# Patient Record
Sex: Male | Born: 1940 | Hispanic: No | Marital: Married | Smoking: Current every day smoker
Health system: Southern US, Community
[De-identification: ages and names within clinical notes are randomized; demographics above are authoritative.]

## PROBLEM LIST (undated history)

## (undated) DIAGNOSIS — C61 Malignant neoplasm of prostate: Secondary | ICD-10-CM

## (undated) HISTORY — PX: HERNIA REPAIR: SHX51

## (undated) HISTORY — PX: PROSTATE BIOPSY: SHX241

---

## 2004-02-11 ENCOUNTER — Ambulatory Visit (HOSPITAL_COMMUNITY): Admission: RE | Admit: 2004-02-11 | Discharge: 2004-02-11 | Payer: Self-pay | Admitting: Surgery

## 2018-05-10 DIAGNOSIS — R972 Elevated prostate specific antigen [PSA]: Secondary | ICD-10-CM | POA: Insufficient documentation

## 2018-05-12 DIAGNOSIS — N402 Nodular prostate without lower urinary tract symptoms: Secondary | ICD-10-CM | POA: Insufficient documentation

## 2018-06-19 DIAGNOSIS — C61 Malignant neoplasm of prostate: Secondary | ICD-10-CM | POA: Insufficient documentation

## 2018-06-25 ENCOUNTER — Telehealth: Payer: Self-pay | Admitting: Radiation Oncology

## 2018-06-25 NOTE — Telephone Encounter (Signed)
As requested by Dr. Tyler Pita I found the patient to confirm appointment in [facility] for Friday, June 27, 2018 at 0800. Provided patient with directions to [facility]. Patient verbalized understanding and expressed appreciation for the call. Sent email request to Leonard Downing to obtain slides from [facility] and prepare a chart.

## 2018-06-26 ENCOUNTER — Telehealth: Payer: Self-pay | Admitting: Medical Oncology

## 2018-06-26 ENCOUNTER — Encounter: Payer: Self-pay | Admitting: Radiation Oncology

## 2018-06-26 NOTE — Telephone Encounter (Signed)
Spoke with son to confirm appointment for [facility] 3/13 arriving at 8:am. He is currently a patient at [facility] and is seeing a second opinion for prostate cancer.

## 2018-06-26 NOTE — Progress Notes (Signed)
GU Location of Tumor / Histology: prostatic adenocarcinoma   If Prostate Cancer, Gleason Score is (4 + 3) and PSA is (138). Prostate volume: 53.35  [facility] had a routine physical examination and found to have a PSA of 143. He had a repeat PSA that was 138. He has no family history of prostate ca.  Biopsies of prostate (if applicable) revealed:  A. PROSTATE, RB, BIOSPY: Adenocarcinoma, Gleason grade 4+3=7 (grade group 3), comprising 25% of 2 (of 2) cores.  B. PROSTATE, RM, BIOSPY: Adenocarcinoma, Gleason grade 4+3=7 (grade group 3), comprising 85% of 2 (of 2) cores.  C. PROSTATE, RA, BIOSPY: Adenocarcinoma, Gleason grade 4+3=7 (grade group 3), comprising 70% of 2 (of 2) cores.  D. PROSTATE, LB, BIOSPY: Adenocarcinoma, Gleason grade 4+3=8 (grade group 3), comprising 5% of 1 (of 2) cores.  E. PROSTATE, LM, BIOSPY: Rare atypical small acinar proliferation.  F. PROSTATE, LA, BIOSPY: Adenocarcinoma, Gleason grade 4+3=7 (grade group 3), comprising 25% of 2 (of 2) cores   Report Prepared By: Willaim Bane , MD  I have personally reviewed the slides and/or other related materials referenced, and have edited the report as part of my pathologic assessment and final interpretation.  Past/Anticipated interventions by urology, if any: biopsy, referral to medical oncology  Past/Anticipated interventions by medical oncology, if any: planning for ADT +/- Enzalutamide  Weight changes, if any: no  Bowel/Bladder complaints, if any:  Denies dysuria, hematuria, urinary frequency, or urgency.    Nausea/Vomiting, if any: no  Pain issues, if any:  no  SAFETY ISSUES:  Prior radiation?   Pacemaker/ICD? no  Possible current pregnancy? no, male patient  Is the patient on methotrexate? no  Current Complaints / other details:  78 year old male. NKDA. Working full time and playing golf 3 times per week. Current everyday  smoker of Cigars. Sister with hx of cancer. PET revealed a mildly avid 1.2 cm left inguinal node.

## 2018-06-27 ENCOUNTER — Telehealth: Payer: Self-pay | Admitting: Oncology

## 2018-06-27 ENCOUNTER — Ambulatory Visit
Admission: RE | Admit: 2018-06-27 | Discharge: 2018-06-27 | Disposition: A | Discharge status: Home / Self Care | Payer: Medicare Other | Source: Ambulatory Visit | Attending: Radiation Oncology | Admitting: Radiation Oncology

## 2018-06-27 ENCOUNTER — Inpatient Hospital Stay: Payer: Medicare Other | Attending: Oncology | Admitting: Oncology

## 2018-06-27 ENCOUNTER — Encounter: Payer: Self-pay | Admitting: Radiation Oncology

## 2018-06-27 ENCOUNTER — Encounter: Payer: Self-pay | Admitting: Medical Oncology

## 2018-06-27 ENCOUNTER — Other Ambulatory Visit: Payer: Self-pay

## 2018-06-27 VITALS — BP 146/89 | HR 81 | Temp 98.0°F | Resp 18 | Ht 71.0 in | Wt 178.4 lb

## 2018-06-27 DIAGNOSIS — Z8042 Family history of malignant neoplasm of prostate: Secondary | ICD-10-CM | POA: Insufficient documentation

## 2018-06-27 DIAGNOSIS — Z800 Family history of malignant neoplasm of digestive organs: Secondary | ICD-10-CM | POA: Diagnosis not present

## 2018-06-27 DIAGNOSIS — C774 Secondary and unspecified malignant neoplasm of inguinal and lower limb lymph nodes: Secondary | ICD-10-CM | POA: Diagnosis not present

## 2018-06-27 DIAGNOSIS — E291 Testicular hypofunction: Secondary | ICD-10-CM | POA: Diagnosis not present

## 2018-06-27 DIAGNOSIS — C61 Malignant neoplasm of prostate: Secondary | ICD-10-CM

## 2018-06-27 DIAGNOSIS — F17210 Nicotine dependence, cigarettes, uncomplicated: Secondary | ICD-10-CM | POA: Diagnosis not present

## 2018-06-27 HISTORY — DX: Malignant neoplasm of prostate: C61

## 2018-06-27 NOTE — Progress Notes (Signed)
                               Care Plan Summary  Name: Mr. Joseph Rose DOB: 12/16/1940  Your Medical Team:   Urologist -  Dr. Raynelle Bring, [facility]  Radiation Oncologist - Dr. Tyler Pita, [facility]   Medical Oncologist - Dr. Zola Button, [place]  Recommendations: 1) Bone scan   2) Androgen deprivation (hormone injection) x 2 years  3) Radiation  * These recommendations are based on information available as of today's consult.      Recommendations may change depending on the results of further tests or exams.  Next Steps: 1) I will follow up with you to see if Dr. Servando Salina will schedule bone scan/hormone injection or if you would like to schedule in [place].   When appointments need to be scheduled, you will be contacted by [facility] and/or [facility]   Patient provided with business cards for all team members and a copy of "Fall Prevention Patient Safety Sheet".  Questions?  Please do not hesitate to call Cira Rue, RN, BSN, OCN at [phone] any questions or concerns.  Shirlean Mylar is your Oncology Nurse Navigator and is available to assist you while you're receiving your medical care at [facility].

## 2018-06-27 NOTE — Progress Notes (Signed)
Radiation Oncology         [phone] ________________________________  Multidisciplinary Prostate Cancer Clinic  Initial Radiation Oncology Consultation  Name: Joseph Rose MRN: [id number]  Date: 06/27/2018  DOB: [birth year]  CC:No primary care provider on file.  No ref. provider found   REFERRING PHYSICIAN: No ref. provider found  DIAGNOSIS: 78 y.o. 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 Adenocarcinoma of prostate, stage 4 ([place]) C61     HISTORY OF PRESENT ILLNESS::Joseph Rose is a 78 y.o. gentleman.  He was noted to have an elevated PSA of 143 by his primary care physician at [facility].  Accordingly, he was referred for evaluation in urology by Dr. Tresa Endo on 05/12/2018,  digital rectal examination was performed at that time revealing slight firmness on the right side.  The patient proceeded to transrectal ultrasound with 12 biopsies of the prostate on 05/16/2018.  The prostate volume measured 53.35 cc.  Out of 12 core biopsies, 9 were positive.  The maximum Gleason score was 4+3, and this was seen in both right base, both right mid, both right apex, one left base, and both left apex cores.  He was referred to Dr. Emeterio Reeve at [facility] oncology on 06/04/2018. He underwent PET scan on 06/13/2018 with results revealing: mildly avid 1.2 cm left inguinal node, no other avid sites identified. The recommendation from Dr. Emeterio Reeve was to undergo systemic treatment due to assumption of metastatic disease.   The patient reviewed the biopsy results with his urologist and he has kindly been referred today to the multidisciplinary prostate cancer clinic for presentation of pathology and radiology studies in our conference for discussion of potential radiation treatment options and clinical evaluation. He is here for a second opinion.    PREVIOUS RADIATION THERAPY: No  PAST MEDICAL HISTORY:  has a  past medical history of Prostate cancer ([place]).    PAST SURGICAL HISTORY: Past Surgical History:  Procedure Laterality Date  . HERNIA REPAIR    . PROSTATE BIOPSY      FAMILY HISTORY: family history includes Cancer in his sister; Prostate cancer in his father.  SOCIAL HISTORY:  reports that he has been smoking cigars. He has never used smokeless tobacco. He reports current alcohol use. He reports that he does not use drugs.  ALLERGIES: Patient has no allergy information on record.  MEDICATIONS:  Current Outpatient Medications  Medication Sig Dispense Refill  . loratadine (CLARITIN) 10 MG tablet Take by mouth.    . Vitamin D, Ergocalciferol, (DRISDOL) 1.25 MG (50000 UT) CAPS capsule TK 1 C PO WEEKLY     No current facility-administered medications for this encounter.     REVIEW OF SYSTEMS:  On review of systems, the patient reports that he is doing well overall. He denies any chest pain, shortness of breath, cough, fevers, chills, night sweats, unintended weight changes. He denies any bowel disturbances, and denies abdominal pain, nausea or vomiting. He denies any new musculoskeletal or joint aches or pains. His IPSS and SHIM were completed and reviewed. A complete review of systems is obtained and is otherwise negative. He continues to work full time and play golf 3 days a week.   PHYSICAL EXAM:  Wt Readings from Last 3 Encounters:  06/27/18 178 lb 6.4 oz (80.9 kg)   Temp Readings from Last 3 Encounters:  06/27/18 98 F (36.7 C) (Oral)   BP  Readings from Last 3 Encounters:  06/27/18 (!) 146/89   Pulse Readings from Last 3 Encounters:  06/27/18 81   Pain Assessment Pain Score: 0-No pain/10  In general this is a well appearing Caucasian gentleman in no acute distress. He is alert and oriented x4 and appropriate throughout the examination. HEENT reveals that the patient is normocephalic, atraumatic. EOMs are intact. PERRLA. Skin is intact without any evidence of gross lesions.  Cardiovascular exam reveals a regular rate and rhythm, no clicks rubs or murmurs are auscultated. Chest is clear to auscultation bilaterally. Lymphatic assessment is performed and does not reveal any adenopathy in the cervical, supraclavicular, axillary, or inguinal chains. Abdomen has active bowel sounds in all quadrants and is intact. The abdomen is soft, non tender, non distended. Lower extremities are negative for pretibial pitting edema, deep calf tenderness, cyanosis or clubbing.  KPS = 100  100 - Normal; no complaints; no evidence of disease. 90   - Able to carry on normal activity; minor signs or symptoms of disease. 80   - Normal activity with effort; some signs or symptoms of disease. 18   - Cares for self; unable to carry on normal activity or to do active work. 60   - Requires occasional assistance, but is able to care for most of his personal needs. 50   - Requires considerable assistance and frequent medical care. 14   - Disabled; requires special care and assistance. 60   - Severely disabled; hospital admission is indicated although death not imminent. 67   - Very sick; hospital admission necessary; active supportive treatment necessary. 10   - Moribund; fatal processes progressing rapidly. 0     - Dead  Karnofsky DA, Abelmann WH, Craver LS and Burchenal JH [phone]) The use of the nitrogen mustards in the palliative treatment of carcinoma: with particular reference to bronchogenic carcinoma Cancer 1 [phone]   LABORATORY DATA:  No results found for: WBC, HGB, HCT, MCV, PLT No results found for: NA, K, CL, CO2 No results found for: ALT, AST, GGT, ALKPHOS, BILITOT   RADIOGRAPHY: No results found.    IMPRESSION/PLAN: 78 y.o. gentleman with Stage T2a adenocarcinoma of the prostate with a PSA of 138 and a Gleason score of 4+3.    We discussed the patient's workup and outlined the nature of prostate cancer in this setting. The patient's T stage, Gleason's score, and PSA put him into  the high risk group. Accordingly, he is eligible for a variety of potential treatment options including ADT in combination with 5-8 weeks of external radiation or prostatectomy. We discussed the option of brachytherapy with 5 weeks of external beam, but that he may not be a good candidate due to the presumption of metastatic disease. We discussed the available radiation techniques, and focused on the details and logistics and delivery. We discussed and outlined the risks, benefits, short and long-term effects associated with radiotherapy. We also detailed the role of ADT in the treatment of high risk prostate cancer and outlined the associated side effects that could be expected with this therapy.  At the end of the conversation the patient is interested in moving forward with 4 weeks of external beam therapy in combination with ADT similar to the STAMPEDE trial. He has not received his first Lupron injection. We will obtain a bone scan and begin ADT in the near future. He will be scheduled for simulation after 2 months of ADT. We will share our discussion with Dr. Rosana Hoes and move forward  with treatment planning in anticipation of beginning IMRT in the near future.      Nicholos Johns, PA-C    Tyler Pita, MD  [facility] Direct Dial: [phone]  Fax: [phone] [email or web address]  Skype  LinkedIn

## 2018-06-27 NOTE — Progress Notes (Signed)
Reason for the request:   Prostate cancer  HPI: 78 year old man currently of [place] where he lived 68 of his life.  He is a rather healthy man who was found to have an elevated PSA of 143.  Based on these findings he was referred to Dr. Rosana Hoes who underwent a prostate biopsy on January 31 of 2020 and showed a Gleason score 4+3 equal 7 in 5 cores.  He underwent staging scan with Axumin PET completed on February 28 of 2020.  The scan shows diffuse uptake in the prostate predominantly in the left.  A 1.2 cm left inguinal node that is mildly avid without any other uptake in any other areas.  He was evaluated by Dr. Jess Barters at [facility] and recommended proceeding with systemic therapy alone utilizing androgen deprivation therapy with possibly adding therapy escalation utilizing enzalutamide.  Local therapy can also be considered if he develops excellent response to his disease.  He was referred to the prostate cancer multidisciplinary clinic for second opinion.  Clinically, he remains asymptomatic without any significant urinary symptoms.  He denies any dysuria, hematuria or excessive fatigue.  He remains active and attends activities of daily living.   He does not report any headaches, blurry vision, syncope or seizures. Does not report any fevers, chills or sweats.  Does not report any cough, wheezing or hemoptysis.  Does not report any chest pain, palpitation, orthopnea or leg edema.  Does not report any nausea, vomiting or abdominal pain.  Does not report any constipation or diarrhea.  Does not report any skeletal complaints.    Does not report frequency, urgency or hematuria.  Does not report any skin rashes or lesions. Does not report any heat or cold intolerance.  Does not report any lymphadenopathy or petechiae.  Does not report any anxiety or depression.  Remaining review of systems is negative.    Past Medical History:  Diagnosis Date  . Prostate cancer [facility])    :  Past Surgical History:  Procedure Laterality Date  . HERNIA REPAIR    . PROSTATE BIOPSY    :   Current Outpatient Medications:  .  loratadine (CLARITIN) 10 MG tablet, Take by mouth., Disp: , Rfl:  .  Vitamin D, Ergocalciferol, (DRISDOL) 1.25 MG (50000 UT) CAPS capsule, TK 1 C PO WEEKLY, Disp: , Rfl: :  Not on File:  Family History  Problem Relation Age of Onset  . Cancer Sister   . Prostate cancer Father        after age 72  :  Social History   Socioeconomic History  . Marital status: Married    Spouse name: Not on file  . Number of children: Not on file  . Years of education: Not on file  . Highest education level: Not on file  Occupational History  . Not on file  Social Needs  . Financial resource strain: Not on file  . Food insecurity:    Worry: Not on file    Inability: Not on file  . Transportation needs:    Medical: Not on file    Non-medical: Not on file  Tobacco Use  . Smoking status: Current Every Day Smoker    Types: Cigars  . Smokeless tobacco: Never Used  Substance and Sexual Activity  . Alcohol use: Yes  . Drug use: Never  . Sexual activity: Not on file  Lifestyle  . Physical activity:    Days per week: Not on file    Minutes  per session: Not on file  . Stress: Not on file  Relationships  . Social connections:    Talks on phone: Not on file    Gets together: Not on file    Attends religious service: Not on file    Active member of club or organization: Not on file    Attends meetings of clubs or organizations: Not on file    Relationship status: Not on file  . Intimate partner violence:    Fear of current or ex partner: Not on file    Emotionally abused: Not on file    Physically abused: Not on file    Forced sexual activity: Not on file  Other Topics Concern  . Not on file  Social History Narrative  . Not on file  :  Pertinent items are noted in HPI.  Exam:  ECOG 0 General appearance: alert and cooperative appeared without  distress. Head: atraumatic without any abnormalities. Eyes: conjunctivae/corneas clear. PERRL.  Sclera anicteric. Throat: lips, mucosa, and tongue normal; without oral thrush or ulcers. Resp: clear to auscultation bilaterally without rhonchi, wheezes or dullness to percussion. Cardio: regular rate and rhythm, S1, S2 normal, no murmur, click, rub or gallop GI: soft, non-tender; bowel sounds normal; no masses,  no organomegaly Skin: Skin color, texture, turgor normal. No rashes or lesions Lymph nodes: Cervical, supraclavicular, and axillary nodes normal. Neurologic: Grossly normal without any motor, sensory or deep tendon reflexes. Musculoskeletal: No joint deformity or effusion.    Assessment and Plan:   78 year old man with prostate cancer diagnosed in January 2020.  He presented with a PSA of 134 although repeated on March 4 of 2020 was 111.78.  Prostate biopsy obtained on January 2020 showed a Gleason score of 4+3 equal 7 and at least 4 cores.  His Axumin PET scan did not show any evidence of convincing metastatic prostate cancer although he does have low avid uptake in small inguinal lymph node.  His case was discussed today in the prostate cancer multidisciplinary clinic.  This includes discussion with our reviewing pathologist as well as review of his PET scan as well.  Treatment options were also reviewed today with the patient and his son.  The natural course of this disease was reviewed today and he understands he is probably advanced disease given his high PSA which is reflective of micrometastatic disease despite negative imaging studies.    Despite these findings, the rationale for treating his localized disease was discussed today.  And there is a lot of evidence to suggest treating primary prostate in the setting of oligo metastatic disease which could be extrapolated for a presumptive biochemical metastatic disease.  And after discussion today, he is in favor with proceeding with  radiation therapy in addition to continuing androgen deprivation.  He understands that androgen deprivation therapy will continue and remains the backbone of treating his advanced prostate cancer.  The role for therapy escalation was discussed today in the form of enzalutamide, abiraterone and systemic chemotherapy.  At this time it would be reasonable to proceed with androgen deprivation therapy alone and consideration for any additional therapy in the future.  All his questions were answered today to his satisfaction.  30  minutes was spent with the patient face-to-face today.  More than 50% of time was spent on reviewing imaging studies, pathology reports, discussing treatment options and answering questions regarding alternative therapy and options of care.   Thank you for the referral.  I had the pleasure of meeting this  patient today.  A copy of this consult has been forwarded to the requesting physician.

## 2018-06-27 NOTE — Addendum Note (Signed)
Encounter addended by: Raynelle Bring, MD on: 06/27/2018 5:09 PM  Actions taken: Clinical Note Signed

## 2018-06-27 NOTE — Telephone Encounter (Signed)
No los °

## 2018-06-27 NOTE — Addendum Note (Signed)
Encounter addended by: [name], PA-C on: 06/27/2018 5:27 PM  Actions taken: Visit Navigator Flowsheet section accepted, Order list changed, Diagnosis association updated

## 2018-06-27 NOTE — Consult Note (Signed)
Multi-Disciplinary Clinic     06/27/2018   --------------------------------------------------------------------------------   Joseph Rose  MRN: [id number]  PRIMARY CARE:  Kristie Cowman, MD  DOB: 30-Sep-1940, 78 year old Male  REFERRING:  Sheryle Spray, MD  SSN: -[id number]  PROVIDER:  Raynelle Bring, M.D.    LOCATION:  [facility] [phone]   --------------------------------------------------------------------------------   CC/HPI: CC: Prostate Cancer   PCP: Dr. Kristie Cowman  Location of consult: [facility]   Joseph Rose is a 78 year old gentleman who was found to have an elevated PSA of 143. He was seen by Dr. Lawerance Bach and his PSA was repeated and noted to still be elevated at 138. 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 He underwent staging imaging with a fluciclovine PET that indicated uptake in a 1.2 cm left inguinal lymph node. He has been evaluated by Dr. Heide Guile who has recommended systemic therapy with androgen deprivation with the option of additional systemic therapy (abiraterone, androgen receptor blockade, or docetaxel) with consideration of local therapy in the future pending his response to systemic therapy.   Family history: No prostate cancer or breast cancer.   Imaging studies:  Fluciclovine PET (06/12/18): Uptake noted in prostate and a mildly avid 1.2 cm left inguinal lymph node. No other sites of metastatic disease.   PMH: He has a history of gout, urolithiasis, and arthritis.  PSH: Hernia repair.   TNM stage: cT2 N0-1 M0  PSA: 138  Gleason score: 4+3=7  Biopsy (05/16/18 - [place], re-read by Dr. Orene Desanctis): 10/12 cores positive  Left: L apex (2/2 cores, 30% and 30%, 4+3=7), L base (2/2 cores, 5% and 5%, 4+3=7)  Right: R apex (2/2  cores, 95% and 95%, 4+3=7), R mid (2/2 cores, 90% and 90%, 4+3=7), R base (2/2 cores, 5% and 80%, 4+3=7)  Prostate volume: 53.4 cc    Urinary function: He has mild urinary urgency but denies significant bothersome symptoms.  Erectile function: He does have preserved erectile function.     ALLERGIES: No Allergies    MEDICATIONS: Celebrex 200 mg capsule Oral     GU PSH: No GU PSH    NON-GU PSH: No Non-GU PSH    GU PMH: History of urolithiasis      PMH Notes:  1898-04-16 00:00:00 - Note: Normal Routine History And Physical Adult   NON-GU PMH: Gout, Gout - 2014    FAMILY HISTORY: Family Health Status Number - Runs In Family Urologic Disorder - Father   SOCIAL HISTORY: No Social History     Notes: Tobacco Use, Caffeine Use, Occupation:, Marital History - Currently Married, Alcohol Use   REVIEW OF SYSTEMS:    GU Review Male:   Patient denies frequent urination, hard to postpone urination, burning/ pain with urination, get up at night to urinate, leakage of urine, stream starts and stops, trouble starting your streams, and have to strain to urinate .  Gastrointestinal (Upper):   Patient denies nausea and vomiting.  Gastrointestinal (Lower):   Patient denies constipation and diarrhea.  Constitutional:   Patient denies fever, night sweats, weight loss, and fatigue.  Skin:   Patient denies skin rash/ lesion and itching.  Eyes:   Patient denies blurred vision and double vision.  Ears/ Nose/ Throat:   Patient denies sore throat and  sinus problems.  Hematologic/Lymphatic:   Patient denies swollen glands and easy bruising.  Cardiovascular:   Patient denies leg swelling and chest pains.  Respiratory:   Patient denies cough and shortness of breath.  Endocrine:   Patient denies excessive thirst.  Musculoskeletal:   Patient denies back pain and joint pain.  Neurological:   Patient denies headaches and dizziness.  Psychologic:   Patient denies depression and anxiety.   VITAL SIGNS: None    MULTI-SYSTEM PHYSICAL EXAMINATION:    Constitutional: Well-nourished. No physical deformities. Normally developed. Good grooming.     PAST DATA REVIEWED:  Source Of History:  Patient  Lab Test Review:   PSA  Records Review:   Pathology Reports  X-Ray Review: PET Scan: Reviewed Films.     05/29/06 12/30/03  PSA  Total PSA 4.86  1.56   Free PSA 0.51    % Free PSA 10.5      PROCEDURES: None   ASSESSMENT:      ICD-10 Details  1 GU:   Prostate Cancer - C61    PLAN:           Schedule Return Visit/Planned Activity: Return PRN          Document Letter(s):  Created for Patient: Clinical Summary         Notes:   1. Very high-risk prostate cancer: I had a detailed discussion with Joseph Rose today regarding his prostate cancer situation. We discussed his situation in the multidisciplinary conference today and he has already seen both Dr. Tammi Klippel and Dr. Alen Blew. Based on his situation, he does not have obvious measurable metastatic disease although is at extremely high risk for harboring micrometastatic disease. We discussed the recommendation to proceed with more definitive bone imaging from his staging standpoint. This is to be arranged per Dr. Tammi Klippel. Assuming this is negative, discuss options for treatment. Currently, he has been recommended to proceed with systemic therapy with androgen deprivation therapy plus or minus additional systemic treatment.   We had further discussion today about the potential risks and benefits of proceeding with definitive treatment of his primary tumor and discussed the results from the STAMPEDE trial. He understands that the chance for cure would be possible but likely very low with treatment of his primary tumor. However, this might provide additional benefit with regard to time to progression of disease. Furthermore, this would help to decrease the risk of developing local symptoms related to his disease. He is in excellent overall health at age 78  and his life expectancy is likely 10 years. As such, the recommendation from our multidisciplinary group was for him to consider the option of radiation therapy to the prostate with long-term and possibly continuous androgen deprivation therapy. We discussed the option of androgen synthesis inhibitor or androgen receptor blockade therapy as well although did not make a strong recommendation for additional systemic therapy at this time. All questions were answered to his stated satisfaction. He is going to further consider his options and does have an appointment with Dr. Rosana Hoes later today to further review his options. He understands that he is in excellent hands with Dr. Rosana Hoes although I did offer to be available to help him in any way in the future if he would like.   It is anticipated that he likely will proceed with androgen deprivation therapy followed by radiation therapy to the prostate based on the discussion today. I will currently plan to allow Dr. Tammi Klippel to coordinate his care with Dr. Rosana Hoes  if he does elect to proceed in this fashion and both Dr. Alen Blew and myself will be happy to participate in his care if he so desires. Therefore, I will not currently scheduled him for any follow-up with me assuming that he continues his urologic care with Dr. Rosana Hoes.   CC: Dr. Tresa Endo  Dr. Heide Guile  Dr. Zola Button  Dr. Tyler Pita        Next Appointment:      Next Appointment: 07/01/2018 04:00 PM    Appointment Type: 10 Minute New Patient    Location: [facility] [phone]    Provider: Raynelle Bring, M.D.    Reason for Visit: prostate ca-pts son made appt      E & M CODE: I spent at least 36 minutes face to face with the patient, more than 50% of that time was spent on counseling and/or coordinating care.

## 2018-06-30 ENCOUNTER — Telehealth: Payer: Self-pay | Admitting: *Deleted

## 2018-06-30 ENCOUNTER — Telehealth: Payer: Self-pay | Admitting: Medical Oncology

## 2018-06-30 NOTE — Telephone Encounter (Signed)
Left message requesting a return call to discuss where he would like to get androgen deprivation at [facility] or here in [place]. He is scheduled for bone scan 07/03/18.

## 2018-06-30 NOTE — Telephone Encounter (Signed)
CALLED PATIENT TO INFORM OF BONE SCAN FOR 07-03-18 , ARRIVAL TIME - 10:45 AM , INJ. TO BE @ 11AM, PATIENT TO RETURN @ 2 PM FOR SCAN, SPOKE WITH PATIENT AND HE IS AWARE OF THIS APPT.

## 2018-07-01 ENCOUNTER — Encounter: Payer: Self-pay | Admitting: General Practice

## 2018-07-01 NOTE — Progress Notes (Signed)
[facility] Psychosocial Distress Screening Spiritual Care  Reached [name], who goes by his middle name Joseph Rose, by phone following Prostate Multidisciplinary Clinic to introduce [place] team/resources, reviewing distress screen per protocol.  The patient scored a 1 on the Psychosocial Distress Thermometer which indicates mild distress. Also assessed for distress and other psychosocial needs.   ONCBCN DISTRESS SCREENING 07/01/2018  Screening Type Initial Screening  Distress experienced in past week (1-10) 1  Referral to support programs Yes   [name] welcomed [facility]DC f/u call, reporting minimal distress, comfort in his team, and active lifestyle. Introduced Nature conservation officer with telephonic availability during COVID-19 precautions, including currently suspended [employer] that will become available again in the future.  Follow up needed: No. Per pt, no other needs at this time, but he knows to contact Team whenever desired for assistance or support.   [facility] [place], [place] Pager [phone] Voicemail [phone]

## 2018-07-03 ENCOUNTER — Ambulatory Visit (HOSPITAL_COMMUNITY): Payer: Medicare Other

## 2018-07-03 ENCOUNTER — Telehealth: Payer: Self-pay | Admitting: Medical Oncology

## 2018-07-03 NOTE — Telephone Encounter (Signed)
Patient called to inform me he rescheduled his bone scan to mid-April due to the COVID-19.

## 2018-07-15 ENCOUNTER — Telehealth: Payer: Self-pay | Admitting: *Deleted

## 2018-07-15 NOTE — Telephone Encounter (Signed)
CALLED PATIENT TO ASK ABOUT WHETHER HE WILL HAVE HIS SEEDS PLACED BY DR. DAVIS OR DR. BORDEN, PATIENT STATES THAT HE HAS AN APPT. WITH DR. DAVIS ON 10-27-18 BUT HE DOESN'T KNOW WHAT IT IS FOR

## 2018-07-16 ENCOUNTER — Telehealth: Payer: Self-pay | Admitting: Urology

## 2018-07-16 NOTE — Telephone Encounter (Signed)
Opened in error

## 2018-07-31 ENCOUNTER — Encounter: Payer: Self-pay | Admitting: Medical Oncology

## 2018-08-05 ENCOUNTER — Encounter (HOSPITAL_COMMUNITY): Payer: Medicare Other

## 2018-08-05 ENCOUNTER — Ambulatory Visit (HOSPITAL_COMMUNITY): Payer: Medicare Other

## 2018-08-05 ENCOUNTER — Telehealth: Payer: Self-pay | Admitting: *Deleted

## 2018-08-05 ENCOUNTER — Encounter (HOSPITAL_COMMUNITY): Payer: Self-pay

## 2018-08-05 NOTE — Telephone Encounter (Signed)
Called patient to inform of bone scan on May 1 - arrival time - 11:45 am @ [facility], lvm for a return call

## 2018-08-05 NOTE — Telephone Encounter (Signed)
Called patient to inform that bone scan has been moved to May 6 @ [facility], - 11:45 am - arrival time- 12 pm for injection and arrival time for return visit- 2:45 pm for 3 pm - scan, spoke with patient and he is aware of this test.

## 2018-08-15 ENCOUNTER — Encounter (HOSPITAL_COMMUNITY): Payer: Medicare Other

## 2018-08-15 ENCOUNTER — Other Ambulatory Visit (HOSPITAL_COMMUNITY): Payer: Medicare Other

## 2018-08-20 ENCOUNTER — Ambulatory Visit (HOSPITAL_COMMUNITY)
Admission: RE | Admit: 2018-08-20 | Discharge: 2018-08-20 | Disposition: A | Discharge status: Home / Self Care | Payer: Medicare Other | Source: Ambulatory Visit | Attending: Urology | Admitting: Urology

## 2018-08-20 ENCOUNTER — Other Ambulatory Visit: Payer: Self-pay

## 2018-08-20 ENCOUNTER — Encounter (HOSPITAL_COMMUNITY)
Admission: RE | Admit: 2018-08-20 | Discharge: 2018-08-20 | Disposition: A | Discharge status: Home / Self Care | Payer: Medicare Other | Source: Ambulatory Visit | Attending: Urology | Admitting: Urology

## 2018-08-20 DIAGNOSIS — C61 Malignant neoplasm of prostate: Secondary | ICD-10-CM | POA: Insufficient documentation

## 2018-08-20 MED ORDER — TECHNETIUM TC 99M MEDRONATE IV KIT
20.0000 | PACK | Freq: Once | INTRAVENOUS | Status: AC | PRN
  Administered 2018-08-20: 21.3 via INTRAVENOUS

## 2018-10-16 ENCOUNTER — Telehealth: Payer: Self-pay | Admitting: *Deleted

## 2018-10-16 NOTE — Telephone Encounter (Signed)
Called patient to ask whether he is going to have fid. markers placed on July 13 with Dr. Lawerance Bach, pt. Informed me that he hasn't decided to do this at this point, I did leave my name and my phone number and asked him to give me a call when he makes up his mind, notified Cira Rue prostate navigator

## 2018-10-23 ENCOUNTER — Telehealth: Payer: Self-pay | Admitting: Medical Oncology

## 2018-10-23 NOTE — Telephone Encounter (Signed)
I spoke with Joseph Rose to discuss treatment plan. He started ADT 4/15 with Dr. Alinda Money and had plans to move forward with radiation. When contacted by Enid Derry 7/2 he informed her that he has decided not to receive radiation at this time. He states that Dr. Servando Salina agreed with ADT but felt he could hold off on the radiation for now. I asked if he will continue care with Dr. Alinda Money or with Dr. Rosana Hoes. He states he would like to receive all his care in [place]. He does not have a follow up with Dr. Alinda Money. I will notify Dr. Alinda Money and Dr. Tammi Klippel of his decision to hold radiation but continue ADT. I asked him to call me with questions or concerns. He voiced understanding.

## 2018-11-25 ENCOUNTER — Telehealth: Payer: Self-pay | Admitting: *Deleted

## 2018-11-25 NOTE — Telephone Encounter (Signed)
CALLED PATIENT TO ASK QUESTION, LVM FOR A RETURN CALL 

## 2019-01-09 ENCOUNTER — Telehealth: Payer: Self-pay | Admitting: Medical Oncology

## 2019-01-09 NOTE — Telephone Encounter (Signed)
Spoke with patient to follow up on treatment plan for his prostate cancer. He states I am doing well and my PSA came down nicely. I asked if he has follow up with Dr. Alinda Money for labs and ADT and he stated, no. I gave him the number to [facility] and asked him to call and make an appointment. He has not made a decision to move forward with radiation. Ashlyn notified of the above.

## 2019-05-07 ENCOUNTER — Encounter: Payer: Self-pay | Admitting: *Deleted

## 2019-05-18 ENCOUNTER — Encounter: Payer: Self-pay | Admitting: Radiation Oncology

## 2019-05-18 NOTE — Progress Notes (Signed)
GU Location of Tumor / Histology: prostatic adenocarcinoma  If Prostate Cancer, Gleason Score is (4 + 3) and PSA is (138). Prostate volume: 53.4 cc    Biopsies of prostate(if applicable) revealed:   Past/Anticipated interventions by urology, if any: prostate biopsy, PET, bone scan, CT scan, started ADT in April 2020, restarted androgen deprivation (Eligard) last week.  To continue ADT for approximately 2 years at least until spring 2022. Reports hot flashes but, denies they are too bothersome. referral for consideration of radiotherapy    Past/Anticipated interventions by medical oncology, if any: no  Weight changes, if any: Denies. Denies even gaining weight after receiving ADT injection.  Bowel/Bladder complaints, if any: IPSS 7. SHIM 1. Denies dysuria, hematuria, urinary leakage or incontinence. He has mild urinary urgency but denies significant bothersome symptoms.   Nausea/Vomiting, if any: no  Pain issues, if any:  denies  SAFETY ISSUES:  Prior radiation? denies  Pacemaker/ICD? denies  Possible current pregnancy? no, male patient  Is the patient on methotrexate? no  Current Complaints / other details:  79 year old male. Married. Father with hx of prostate ca. Patient seen previously in [facility]. Patient and his wife were diagnosed with COVID in January. Patient reports they are just now beginning to recover.

## 2019-05-19 ENCOUNTER — Other Ambulatory Visit: Payer: Self-pay

## 2019-05-19 ENCOUNTER — Encounter: Payer: Self-pay | Admitting: Radiation Oncology

## 2019-05-19 ENCOUNTER — Ambulatory Visit
Admission: RE | Admit: 2019-05-19 | Discharge: 2019-05-19 | Disposition: A | Discharge status: Home / Self Care | Payer: Medicare Other | Source: Ambulatory Visit | Attending: Radiation Oncology | Admitting: Radiation Oncology

## 2019-05-19 VITALS — Ht 71.0 in | Wt 178.0 lb

## 2019-05-19 DIAGNOSIS — C61 Malignant neoplasm of prostate: Secondary | ICD-10-CM

## 2019-05-19 NOTE — Progress Notes (Signed)
Radiation Oncology         [phone] ________________________________  Outpatient Re-Consultation - Conducted via telephone due to current COVID-19 concerns for limiting Joseph Rose exposure  Name: Joseph Rose MRN: [id number]  Date: 05/19/2019  DOB: [birth year]  EL:FYBOF, Raynelle Dick, MD  Raynelle Bring, MD   REFERRING PHYSICIAN: Raynelle Bring, MD  DIAGNOSIS: 79 y.o. gentleman with locally advanced, stage T2a adenocarcinoma of the prostate with a Gleason's score of 4+3 and a PSA of 26.5 with ADT    ICD-10-CM   1. Adenocarcinoma of prostate, stage 4 ([place])  C61     HISTORY OF PRESENT ILLNESS::Joseph Rose is a 79 y.o. gentleman with a diagnosis of prostate cancer.  In summary, he was noted to have an elevated PSA of 143 by his primary care physician at [facility].  Accordingly, he was referred for evaluation in urology by Dr. Tresa Endo on 05/12/2018,  digital rectal examination was performed at that time revealing slight firmness on the right side.  The Joseph Rose proceeded to transrectal ultrasound with 12 biopsies of the prostate on 05/16/2018.  The prostate volume measured 53.35 cc.  Out of 12 core biopsies, 9 were positive.  The maximum Gleason score was 4+3, and this was seen in both of the right base cores, both right mid, both right apex, one left base, and both left apex cores.  He was referred to Dr. Emeterio Reeve at [facility] oncology on 06/04/2018. He underwent PET scan on 06/13/2018 with results revealing a mildly avid 1.2 cm left inguinal node but no other avid sites identified. The recommendation from Dr. Emeterio Reeve was to undergo systemic treatment due to assumption of oligometastatic disease.   He was seen in the multidisciplinary prostate clinic on 06/27/2018 with recommendation to proceed with treatment of curative intent with LT-ADT concurrent with hypo-fractionated prostate IMRT to include the pelvic nodes. Following that visit, he was started on ADT (Eligard) on 07/30/2018 with  Dr. Alinda Money. He underwent bone scan on 08/20/2018 which showed a single site of subtle abnormal increased tracer localization at the superior right iliac bone, corresponding to a potential small metastatic focus on CT. however, on further review of his prior CT scans, this lucency appeared stable for many years and there were no other sclerotic areas raising significant concern for metastatic prostate cancer.  He then sought a second opinion with Dr. Rosana Hoes and decided not to proceed with radiation therapy. When he met back with Dr. Alinda Money in 10/2018, his PSA was 19.8, vastly improved from 143. However, he remained undecided about treatment but intended to make a decision within the next 1-2 weeks. Unfortunately, he was lost to follow up for several months thereafter and missed his scheduled dose of Lupron in 01/2019.  He returned to Dr. Alinda Money on 05/06/2019 to further discuss his treatment options and reported that he was inclined to proceed with treatment of curative intent. A PSA repeated on 05/07/19 was 26.5, and he received another 45 mg Eligard ADT (6 month) injection on 05/13/2019.   He has kindly been referred back to [place] today to discuss proceeding with external beam radiation concurrent with ADT.  Of note, he and his wife contracted Covid about a month ago. He reports they are just now beginning to recover.    PREVIOUS RADIATION THERAPY: No  PAST MEDICAL HISTORY:  has a past medical history of Prostate cancer ([place]).    PAST SURGICAL HISTORY: Past Surgical History:  Procedure Laterality Date  . HERNIA REPAIR    .  PROSTATE BIOPSY      FAMILY HISTORY: family history includes Cancer in his sister; Prostate cancer (age of onset: 2) in his father.  SOCIAL HISTORY:  reports that he has been smoking cigars. He has smoked for the past 30.00 years. He has never used smokeless tobacco. He reports current alcohol use. He reports that he does not use drugs.  ALLERGIES: Joseph Rose has no known  allergies.  MEDICATIONS:  Current Outpatient Medications  Medication Sig Dispense Refill  . Ascorbic Acid (VITAMIN C) 1000 MG tablet Take 2,000 mg by mouth daily.    . Azelastine HCl 0.15 % SOLN SMARTSIG:2 Spray(s) Both Nares Twice Daily PRN    . Cholecalciferol (VITAMIN D3) 50 MCG (2000 UT) TABS Take 1 tablet by mouth daily.    . Ginseng 100 MG CAPS Take by mouth.    . NON FORMULARY Gout plex    . Turmeric (QC TUMERIC COMPLEX) 500 MG CAPS Take by mouth.    . Vitamin D, Ergocalciferol, (DRISDOL) 1.25 MG (50000 UT) CAPS capsule TK 1 C PO WEEKLY    . ALLEGRA-D ALLERGY & CONGESTION 180-240 MG 24 hr tablet Take 1 tablet by mouth daily.    Marland Kitchen loratadine (CLARITIN) 10 MG tablet Take by mouth.     No current facility-administered medications for this encounter.    REVIEW OF SYSTEMS:  On review of systems, the Joseph Rose reports that he is doing well overall. He denies any chest pain, shortness of breath, cough, fevers, chills, night sweats, unintended weight changes. He denies any bowel disturbances, and denies abdominal pain, nausea or vomiting. He denies any new musculoskeletal or joint aches or pains. His IPSS was 7, indicating mild to moderate symptoms. He reports mild urinary urgency but denies significant bothersome symptoms. His SHIM score was 1, indicating he has severe erectile dysfunction. A complete review of systems is obtained and is otherwise negative.   PHYSICAL EXAM:  Wt Readings from Last 3 Encounters:  05/19/19 178 lb (80.7 kg)  06/27/18 178 lb 6.4 oz (80.9 kg)   Temp Readings from Last 3 Encounters:  06/27/18 98 F (36.7 C) (Oral)   BP Readings from Last 3 Encounters:  06/27/18 (!) 146/89   Pulse Readings from Last 3 Encounters:  06/27/18 81   Pain Assessment Pain Score: 0-No pain/10  Physical exam not performed in light of telephone encounter.   KPS = 100  100 - Normal; no complaints; no evidence of disease. 90   - Able to carry on normal activity; minor signs or  symptoms of disease. 80   - Normal activity with effort; some signs or symptoms of disease. 45   - Cares for self; unable to carry on normal activity or to do active work. 60   - Requires occasional assistance, but is able to care for most of his personal needs. 50   - Requires considerable assistance and frequent medical care. 31   - Disabled; requires special care and assistance. 61   - Severely disabled; hospital admission is indicated although death not imminent. 83   - Very sick; hospital admission necessary; active supportive treatment necessary. 10   - Moribund; fatal processes progressing rapidly. 0     - Dead  Karnofsky DA, Abelmann WH, Craver LS and Burchenal JH [phone]) The use of the nitrogen mustards in the palliative treatment of carcinoma: with particular reference to bronchogenic carcinoma Cancer 1 634-56   LABORATORY DATA:  No results found for: WBC, HGB, HCT, MCV, PLT No results found  for: NA, K, CL, CO2 No results found for: ALT, AST, GGT, ALKPHOS, BILITOT   RADIOGRAPHY: No results found.    IMPRESSION/PLAN: This visit was conducted via telephone to spare the Joseph Rose unnecessary potential exposure in the healthcare setting during the current COVID-19 pandemic. 1. 79 y.o. gentleman with Stage cT2a, locally advanced adenocarcinoma of the prostate with a PSA of 26.5 on ADT, and a Gleason score of 4+3.   We discussed the Joseph Rose's workup and outlined the nature of prostate cancer in this setting. The Joseph Rose's T stage, Gleason's score, and PSA put him into the high risk group. Accordingly, he is eligible for a variety of potential treatment options including ADT in concurrent with 4 weeks of hypofractionated external radiation or prostatectomy. We discussed the available radiation techniques, and focused on the details and logistics of delivery. We discussed and outlined the risks, benefits, short and long-term effects associated with radiotherapy. We also dreviewed the role of  ADT in the treatment of high risk prostate cancer and outlined the associated side effects that could be expected with this therapy which he is familiar with, having started ADT in 06/2018.  At the end of the conversation, the Joseph Rose is interested in moving forward with 4 weeks of external beam therapy in combination with ADT similar to the STAMPEDE trial which will include a boost to the involved left inguinal node. His most recent Eligard injection was on 05/13/2019. We will contact [facility] to make arrangements for fiducial marker with [employer] to reduce rectal toxicity from radiotherapy placement prior to simulation. He will be scheduled for simulation/treatment planning shortly therafter in anticipation of beginning his daily radiation treatments in the near future. We will share our discussion with Dr. Alinda Money and move forward with treatment planning accordingly.  Given current concerns for Joseph Rose exposure during the COVID-19 pandemic, this encounter was conducted via telephone. The Joseph Rose was notified in advance and was offered a [place] meeting to allow for face to face communication but unfortunately reported that he did not have the appropriate resources/technology to support such a visit and instead preferred to proceed with telephone consult. The Joseph Rose has given verbal consent for this type of encounter. The time spent during this encounter was 45 minutes. The attendants for this meeting include Tyler Pita MD, Ashlyn Bruning PA-C, [place], and Joseph Rose, Joseph Rose. During the encounter, Tyler Pita MD, Ashlyn Bruning PA-C, and scribe, Wilburn Mylar were located at [place].  Joseph Rose, Joseph Rose was located at home.     Nicholos Johns, PA-C    Tyler Pita, MD  [facility] Direct Dial: [phone]  Fax: [phone] .com  Skype   LinkedIn   This document serves as a record of services personally performed by Tyler Pita, MD and [name], PA-C. It was created on their behalf by Wilburn Mylar, a trained medical scribe. The creation of this record is based on the scribe's personal observations and the provider's statements to them. This document has been checked and approved by the attending provider.

## 2019-05-19 NOTE — Progress Notes (Signed)
See progress note under physician encounter. 

## 2019-05-21 ENCOUNTER — Telehealth: Payer: Self-pay | Admitting: Medical Oncology

## 2019-05-21 NOTE — Telephone Encounter (Signed)
Left message to follow up post visit with Joseph Rose, [place].  I met patient in the [facility] 06/27/18. He started ADT but did not want to receive radiation at that time. He returned to see Dr. Alinda Money in January and has decided on curative tx. He received Eligard 1/27 and will be scheduled for CT simulation in the future. I asked him to call me with questions or concerns.

## 2019-05-27 ENCOUNTER — Telehealth: Payer: Self-pay | Admitting: *Deleted

## 2019-05-27 NOTE — Telephone Encounter (Signed)
Called patient to inform of fid. marker and space oar placement on 07-02-19 @ [facility] and his sim on 07-07-19 - arrival time- 10:45 am @ Dr. Johny Shears Office, spoke with patient's wife Hoyle Sauer and she is aware of these appts.

## 2019-06-03 ENCOUNTER — Other Ambulatory Visit: Payer: Self-pay | Admitting: Urology

## 2019-06-03 DIAGNOSIS — C61 Malignant neoplasm of prostate: Secondary | ICD-10-CM

## 2019-06-30 ENCOUNTER — Telehealth: Payer: Self-pay | Admitting: *Deleted

## 2019-06-30 NOTE — Telephone Encounter (Signed)
CALLED PATIENT TO INFORM OF SIM AND MRI FOR 07-21-19, SPOKE WITH PATIENT AND HE IS AWARE OF THESE APPTS.

## 2019-07-05 IMAGING — NM NUCLEAR MEDICINE WHOLE BODY BONE SCINTIGRAPHY
2 series · 2 of 2 positions shown · non-contrast
Comparison: None

Correlation: PET-CT 06/12/2018

CLINICAL DATA: Prostate cancer question stage IV/recurrent; most
recent PSA = 143

EXAM:
NUCLEAR MEDICINE WHOLE BODY BONE SCAN
TECHNIQUE: Whole body anterior and posterior images were obtained approximately
3 hours after intravenous injection of radiopharmaceutical.
RADIOPHARMACEUTICALS:  21.3 mCi 9echnetium-99m MDP IV

[Series 1: wbr_bone_40 whole body · 2.66mm/px · 1 of 1 slices shown (1 of 2)]
[im 1/1]
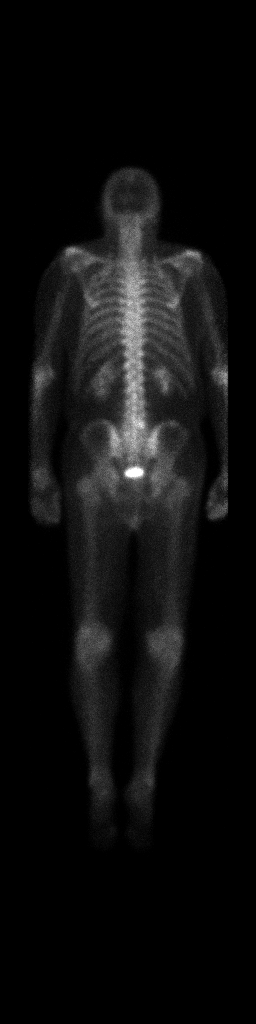

[Series 1: wbr_bone_40 whole body · 2.66mm/px · 1 of 1 slices shown (2 of 2)]
[im 1/1]
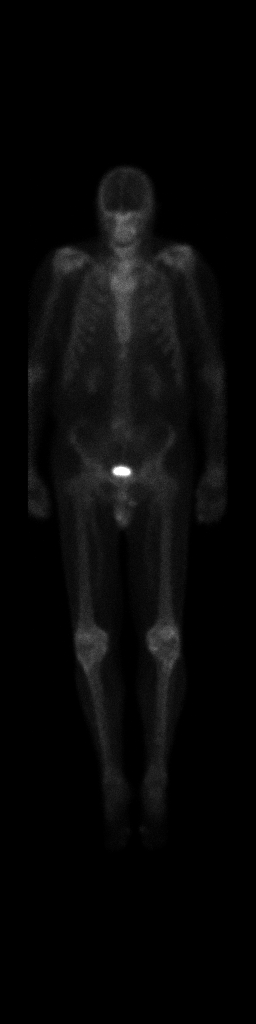

[2 of 2 positions shown; findings below may reference images not displayed]

FINDINGS: Minimal uptake at the hips and knees, typically degenerative.

Uptake in the lumbar spine at RIGHT L4-L5, typically degenerative.

Single subtle focus of abnormal increased tracer localization at the
superior aspect of the RIGHT iliac bone, corresponding to a faint
lytic lesion with a sclerotic rim on CT.

No additional sites of worrisome osseous tracer accumulation are
identified.

Expected urinary tract and soft tissue distribution of tracer.
IMPRESSION: Single site of subtle abnormal increased tracer localization at the
superior RIGHT iliac bone, corresponding to a potential small
metastatic focus on CT.

## 2019-07-06 ENCOUNTER — Encounter (INDEPENDENT_AMBULATORY_CARE_PROVIDER_SITE_OTHER): Payer: Self-pay | Admitting: Otolaryngology

## 2019-07-06 ENCOUNTER — Ambulatory Visit (INDEPENDENT_AMBULATORY_CARE_PROVIDER_SITE_OTHER): Payer: Medicare Other | Admitting: Otolaryngology

## 2019-07-06 ENCOUNTER — Other Ambulatory Visit: Payer: Self-pay

## 2019-07-06 VITALS — Temp 98.1°F

## 2019-07-06 DIAGNOSIS — J310 Chronic rhinitis: Secondary | ICD-10-CM | POA: Diagnosis not present

## 2019-07-06 NOTE — Progress Notes (Signed)
HPI: Joseph Rose is a 79 y.o. male who presents for evaluation of chronic postnasal drainage.  Patient apparently had a sinus infection back in November.  He complains of intermittent stuffy nose as well as some nasal drainage which is generally clear.  He is presently not using any nasal sprays. He was diagnosed with prostate cancer this past year and I reviewed a PET scan performed in January of this year at [facility] that demonstrated clear sinuses.  Past Medical History:  Diagnosis Date  . Prostate cancer [facility])    Past Surgical History:  Procedure Laterality Date  . HERNIA REPAIR    . PROSTATE BIOPSY     Social History   Socioeconomic History  . Marital status: Married    Spouse name: Not on file  . Number of children: Not on file  . Years of education: Not on file  . Highest education level: Not on file  Occupational History  . Not on file  Tobacco Use  . Smoking status: Current Every Day Smoker    Years: 31.00    Types: Cigars    Start date: 56  . Smokeless tobacco: Never Used  . Tobacco comment: only smokes when he plays golf.  Substance and Sexual Activity  . Alcohol use: Yes  . Drug use: Never  . Sexual activity: Not Currently  Other Topics Concern  . Not on file  Social History Narrative  . Not on file   Social Determinants of Health   Financial Resource Strain:   . Difficulty of Paying Living Expenses:   Food Insecurity:   . Worried About Charity fundraiser in the Last Year:   . Arboriculturist in the Last Year:   Transportation Needs:   . Film/video editor (Medical):   Marland Kitchen Lack of Transportation (Non-Medical):   Physical Activity:   . Days of Exercise per Week:   . Minutes of Exercise per Session:   Stress:   . Feeling of Stress :   Social Connections:   . Frequency of Communication with Friends and Family:   . Frequency of Social Gatherings with Friends and Family:   . Attends Religious Services:   . Active Member of Clubs or  Organizations:   . Attends Archivist Meetings:   Marland Kitchen Marital Status:    Family History  Problem Relation Age of Onset  . Cancer Sister        cancer in blood  . Prostate cancer Father 102  . Breast cancer Neg Hx   . Colon cancer Neg Hx   . Pancreatic cancer Neg Hx    No Known Allergies Prior to Admission medications   Medication Sig Start Date End Date Taking? Authorizing Provider  ALLEGRA-D ALLERGY & CONGESTION 180-240 MG 24 hr tablet Take 1 tablet by mouth daily. 02/18/19   [provider]  Ascorbic Acid (VITAMIN C) 1000 MG tablet Take 2,000 mg by mouth daily. 04/22/19   [provider]  Azelastine HCl 0.15 % SOLN SMARTSIG:2 Spray(s) Both Nares Twice Daily PRN 03/16/19   [provider]  Cholecalciferol (VITAMIN D3) 50 MCG (2000 UT) TABS Take 1 tablet by mouth daily. 04/22/19   [provider]  Ginseng 100 MG CAPS Take by mouth.    [provider]  loratadine (CLARITIN) 10 MG tablet Take by mouth.    [provider]  NON FORMULARY Gout plex    [provider]  Turmeric (QC TUMERIC COMPLEX) 500 MG  CAPS Take by mouth.    [provider]  Vitamin D, Ergocalciferol, (DRISDOL) 1.25 MG (50000 UT) CAPS capsule TK 1 C PO WEEKLY 04/30/18   [provider]     Positive ROS: Otherwise negative  All other systems have been reviewed and were otherwise negative with the exception of those mentioned in the HPI and as above.  Physical Exam: Constitutional: Alert, well-appearing, no acute distress Ears: External ears without lesions or tenderness. Ear canals are clear bilaterally with intact, clear TMs.  Nasal: External nose without lesions. Septum with minimal deformity..  Both middle meatus regions were clear with no signs of infection.  Mucus within the nasal cavity is clear. Oral: Lips and gums without lesions. Tongue and palate mucosa without lesions. Posterior oropharynx clear. Neck: No palpable adenopathy  or masses Respiratory: Breathing comfortably  Skin: No facial/neck lesions or rash noted.  Procedures  Assessment: Chronic rhinitis  Plan: Prescribed Nasacort 2 sprays each nostril at night.  Suggested use of saline irrigation during the day and suggested trying Xlear brand saline irrigation. He will follow-up if symptoms worsen.  Radene Journey, MD

## 2019-07-07 ENCOUNTER — Ambulatory Visit (HOSPITAL_COMMUNITY): Payer: Medicare Other

## 2019-07-07 ENCOUNTER — Ambulatory Visit: Payer: Medicare Other | Admitting: Radiation Oncology

## 2019-07-20 ENCOUNTER — Telehealth: Payer: Self-pay | Admitting: *Deleted

## 2019-07-20 NOTE — Telephone Encounter (Signed)
CALLED PATIENT TO REMIND OF SIM AND MRI APPT. FOR 07-21-19, SPOKE WITH PATIENT'S WIFE, CAROLYN AND SHE IS AWARE OF THESE APPTS.

## 2019-07-21 ENCOUNTER — Ambulatory Visit
Admission: RE | Admit: 2019-07-21 | Discharge: 2019-07-21 | Disposition: A | Discharge status: Home / Self Care | Payer: Medicare Other | Source: Ambulatory Visit | Attending: Radiation Oncology | Admitting: Radiation Oncology

## 2019-07-21 ENCOUNTER — Ambulatory Visit (HOSPITAL_COMMUNITY)
Admission: RE | Admit: 2019-07-21 | Discharge: 2019-07-21 | Disposition: A | Discharge status: Home / Self Care | Payer: Medicare Other | Source: Ambulatory Visit | Attending: Urology | Admitting: Urology

## 2019-07-21 ENCOUNTER — Other Ambulatory Visit: Payer: Self-pay

## 2019-07-21 DIAGNOSIS — C61 Malignant neoplasm of prostate: Secondary | ICD-10-CM | POA: Insufficient documentation

## 2019-07-21 DIAGNOSIS — Z510 Encounter for antineoplastic radiation therapy: Secondary | ICD-10-CM | POA: Diagnosis not present

## 2019-07-24 NOTE — Progress Notes (Signed)
  Radiation Oncology         [phone]) [phone] ________________________________  Name: Joseph Rose MRN: [id number]  Date: 07/21/2019  DOB: 1941/03/16  SIMULATION AND TREATMENT PLANNING NOTE    ICD-10-CM   1. Adenocarcinoma of prostate, stage 4 ([place])  C61     DIAGNOSIS:  79 y.o. 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  Identity was confirmed.  All relevant records and images related to the planned course of therapy were reviewed.  The patient freely provided informed written consent to proceed with treatment after reviewing the details related to the planned course of therapy. The consent form was witnessed and verified by the simulation staff.  Then, the patient was set-up in a stable reproducible supine position for radiation therapy.  A vacuum lock pillow device was custom fabricated to position his legs in a reproducible immobilized position.  Then, I performed a urethrogram under sterile conditions to identify the prostatic apex.  CT images were obtained.  Surface markings were placed.  The CT images were loaded into the planning software.  Then the prostate target and avoidance structures including the rectum, bladder, bowel and hips were contoured.  Treatment planning then occurred.  The radiation prescription was entered and confirmed.  A total of one complex treatment devices was fabricated. I have requested : Intensity Modulated Radiotherapy (IMRT) is medically necessary for this case for the following reason:  Rectal sparing.Marland Kitchen  PLAN:  The patient will receive 55 Gy in 20 fractions using the STAMPEDE fractionation.  ________________________________  Sheral Apley Tammi Klippel, M.D.

## 2019-07-27 ENCOUNTER — Encounter: Payer: Self-pay | Admitting: Medical Oncology

## 2019-07-27 DIAGNOSIS — Z510 Encounter for antineoplastic radiation therapy: Secondary | ICD-10-CM | POA: Diagnosis not present

## 2019-07-30 ENCOUNTER — Encounter: Payer: Self-pay | Admitting: Medical Oncology

## 2019-07-30 ENCOUNTER — Other Ambulatory Visit: Payer: Self-pay

## 2019-07-30 ENCOUNTER — Ambulatory Visit
Admission: RE | Admit: 2019-07-30 | Discharge: 2019-07-30 | Disposition: A | Discharge status: Home / Self Care | Payer: Medicare Other | Source: Ambulatory Visit | Attending: Radiation Oncology | Admitting: Radiation Oncology

## 2019-07-30 DIAGNOSIS — Z510 Encounter for antineoplastic radiation therapy: Secondary | ICD-10-CM | POA: Diagnosis not present

## 2019-07-30 NOTE — Progress Notes (Signed)
Joseph Rose her for first radiation treatment. We discussed the importance of having a comfortably full bladder prior to radiation to help reduce side effects. His bladder filling was not adequate on today's CT. He and his wife voiced understanding. I told him, it may take a few days of treatment before he gets it down.

## 2019-07-31 ENCOUNTER — Ambulatory Visit
Admission: RE | Admit: 2019-07-31 | Discharge: 2019-07-31 | Disposition: A | Discharge status: Home / Self Care | Payer: Medicare Other | Source: Ambulatory Visit | Attending: Radiation Oncology | Admitting: Radiation Oncology

## 2019-07-31 DIAGNOSIS — Z510 Encounter for antineoplastic radiation therapy: Secondary | ICD-10-CM | POA: Diagnosis not present

## 2019-08-03 ENCOUNTER — Other Ambulatory Visit: Payer: Self-pay

## 2019-08-03 ENCOUNTER — Ambulatory Visit
Admission: RE | Admit: 2019-08-03 | Discharge: 2019-08-03 | Disposition: A | Discharge status: Home / Self Care | Payer: Medicare Other | Source: Ambulatory Visit | Attending: Radiation Oncology | Admitting: Radiation Oncology

## 2019-08-03 DIAGNOSIS — Z510 Encounter for antineoplastic radiation therapy: Secondary | ICD-10-CM | POA: Diagnosis not present

## 2019-08-04 ENCOUNTER — Other Ambulatory Visit: Payer: Self-pay

## 2019-08-04 ENCOUNTER — Ambulatory Visit
Admission: RE | Admit: 2019-08-04 | Discharge: 2019-08-04 | Disposition: A | Discharge status: Home / Self Care | Payer: Medicare Other | Source: Ambulatory Visit | Attending: Radiation Oncology | Admitting: Radiation Oncology

## 2019-08-04 DIAGNOSIS — Z510 Encounter for antineoplastic radiation therapy: Secondary | ICD-10-CM | POA: Diagnosis not present

## 2019-08-05 ENCOUNTER — Other Ambulatory Visit: Payer: Self-pay

## 2019-08-05 ENCOUNTER — Ambulatory Visit
Admission: RE | Admit: 2019-08-05 | Discharge: 2019-08-05 | Disposition: A | Discharge status: Home / Self Care | Payer: Medicare Other | Source: Ambulatory Visit | Attending: Radiation Oncology | Admitting: Radiation Oncology

## 2019-08-05 DIAGNOSIS — Z510 Encounter for antineoplastic radiation therapy: Secondary | ICD-10-CM | POA: Diagnosis not present

## 2019-08-06 ENCOUNTER — Ambulatory Visit
Admission: RE | Admit: 2019-08-06 | Discharge: 2019-08-06 | Disposition: A | Discharge status: Home / Self Care | Payer: Medicare Other | Source: Ambulatory Visit | Attending: Radiation Oncology | Admitting: Radiation Oncology

## 2019-08-06 ENCOUNTER — Other Ambulatory Visit: Payer: Self-pay

## 2019-08-06 DIAGNOSIS — Z510 Encounter for antineoplastic radiation therapy: Secondary | ICD-10-CM | POA: Diagnosis not present

## 2019-08-07 ENCOUNTER — Other Ambulatory Visit: Payer: Self-pay

## 2019-08-07 ENCOUNTER — Ambulatory Visit
Admission: RE | Admit: 2019-08-07 | Discharge: 2019-08-07 | Disposition: A | Discharge status: Home / Self Care | Payer: Medicare Other | Source: Ambulatory Visit | Attending: Radiation Oncology | Admitting: Radiation Oncology

## 2019-08-07 DIAGNOSIS — Z510 Encounter for antineoplastic radiation therapy: Secondary | ICD-10-CM | POA: Diagnosis not present

## 2019-08-10 ENCOUNTER — Other Ambulatory Visit: Payer: Self-pay

## 2019-08-10 ENCOUNTER — Ambulatory Visit
Admission: RE | Admit: 2019-08-10 | Discharge: 2019-08-10 | Disposition: A | Discharge status: Home / Self Care | Payer: Medicare Other | Source: Ambulatory Visit | Attending: Radiation Oncology | Admitting: Radiation Oncology

## 2019-08-10 DIAGNOSIS — Z510 Encounter for antineoplastic radiation therapy: Secondary | ICD-10-CM | POA: Diagnosis not present

## 2019-08-11 ENCOUNTER — Other Ambulatory Visit: Payer: Self-pay

## 2019-08-11 ENCOUNTER — Ambulatory Visit
Admission: RE | Admit: 2019-08-11 | Discharge: 2019-08-11 | Disposition: A | Discharge status: Home / Self Care | Payer: Medicare Other | Source: Ambulatory Visit | Attending: Radiation Oncology | Admitting: Radiation Oncology

## 2019-08-11 DIAGNOSIS — Z510 Encounter for antineoplastic radiation therapy: Secondary | ICD-10-CM | POA: Diagnosis not present

## 2019-08-12 ENCOUNTER — Ambulatory Visit
Admission: RE | Admit: 2019-08-12 | Discharge: 2019-08-12 | Disposition: A | Discharge status: Home / Self Care | Payer: Medicare Other | Source: Ambulatory Visit | Attending: Radiation Oncology | Admitting: Radiation Oncology

## 2019-08-12 ENCOUNTER — Other Ambulatory Visit: Payer: Self-pay

## 2019-08-12 DIAGNOSIS — Z510 Encounter for antineoplastic radiation therapy: Secondary | ICD-10-CM | POA: Diagnosis not present

## 2019-08-13 ENCOUNTER — Other Ambulatory Visit: Payer: Self-pay

## 2019-08-13 ENCOUNTER — Ambulatory Visit
Admission: RE | Admit: 2019-08-13 | Discharge: 2019-08-13 | Disposition: A | Discharge status: Home / Self Care | Payer: Medicare Other | Source: Ambulatory Visit | Attending: Radiation Oncology | Admitting: Radiation Oncology

## 2019-08-13 DIAGNOSIS — Z510 Encounter for antineoplastic radiation therapy: Secondary | ICD-10-CM | POA: Diagnosis not present

## 2019-08-14 ENCOUNTER — Other Ambulatory Visit: Payer: Self-pay

## 2019-08-14 ENCOUNTER — Ambulatory Visit
Admission: RE | Admit: 2019-08-14 | Discharge: 2019-08-14 | Disposition: A | Discharge status: Home / Self Care | Payer: Medicare Other | Source: Ambulatory Visit | Attending: Radiation Oncology | Admitting: Radiation Oncology

## 2019-08-14 DIAGNOSIS — Z510 Encounter for antineoplastic radiation therapy: Secondary | ICD-10-CM | POA: Diagnosis not present

## 2019-08-17 ENCOUNTER — Other Ambulatory Visit: Payer: Self-pay

## 2019-08-17 ENCOUNTER — Ambulatory Visit
Admission: RE | Admit: 2019-08-17 | Discharge: 2019-08-17 | Disposition: A | Discharge status: Home / Self Care | Payer: Medicare Other | Source: Ambulatory Visit | Attending: Radiation Oncology | Admitting: Radiation Oncology

## 2019-08-17 DIAGNOSIS — C61 Malignant neoplasm of prostate: Secondary | ICD-10-CM | POA: Diagnosis present

## 2019-08-17 DIAGNOSIS — Z510 Encounter for antineoplastic radiation therapy: Secondary | ICD-10-CM | POA: Insufficient documentation

## 2019-08-18 ENCOUNTER — Ambulatory Visit
Admission: RE | Admit: 2019-08-18 | Discharge: 2019-08-18 | Disposition: A | Discharge status: Home / Self Care | Payer: Medicare Other | Source: Ambulatory Visit | Attending: Radiation Oncology | Admitting: Radiation Oncology

## 2019-08-18 ENCOUNTER — Other Ambulatory Visit: Payer: Self-pay

## 2019-08-18 DIAGNOSIS — Z510 Encounter for antineoplastic radiation therapy: Secondary | ICD-10-CM | POA: Diagnosis not present

## 2019-08-19 ENCOUNTER — Ambulatory Visit
Admission: RE | Admit: 2019-08-19 | Discharge: 2019-08-19 | Disposition: A | Discharge status: Home / Self Care | Payer: Medicare Other | Source: Ambulatory Visit | Attending: Radiation Oncology | Admitting: Radiation Oncology

## 2019-08-19 ENCOUNTER — Other Ambulatory Visit: Payer: Self-pay

## 2019-08-19 DIAGNOSIS — Z510 Encounter for antineoplastic radiation therapy: Secondary | ICD-10-CM | POA: Diagnosis not present

## 2019-08-20 ENCOUNTER — Ambulatory Visit
Admission: RE | Admit: 2019-08-20 | Discharge: 2019-08-20 | Disposition: A | Discharge status: Home / Self Care | Payer: Medicare Other | Source: Ambulatory Visit | Attending: Radiation Oncology | Admitting: Radiation Oncology

## 2019-08-20 ENCOUNTER — Other Ambulatory Visit: Payer: Self-pay

## 2019-08-20 DIAGNOSIS — Z510 Encounter for antineoplastic radiation therapy: Secondary | ICD-10-CM | POA: Diagnosis not present

## 2019-08-21 ENCOUNTER — Ambulatory Visit
Admission: RE | Admit: 2019-08-21 | Discharge: 2019-08-21 | Disposition: A | Discharge status: Home / Self Care | Payer: Medicare Other | Source: Ambulatory Visit | Attending: Radiation Oncology | Admitting: Radiation Oncology

## 2019-08-21 ENCOUNTER — Other Ambulatory Visit: Payer: Self-pay

## 2019-08-21 ENCOUNTER — Encounter: Payer: Self-pay | Admitting: Medical Oncology

## 2019-08-21 DIAGNOSIS — Z510 Encounter for antineoplastic radiation therapy: Secondary | ICD-10-CM | POA: Diagnosis not present

## 2019-08-21 NOTE — Progress Notes (Signed)
Joseph Rose will complete radiation 5/12. He states he is doing well without any major side effects. We discussed the importance of keeping appointments with Dr. Alinda Money and monitoring his PSA. He voiced understanding.

## 2019-08-24 ENCOUNTER — Other Ambulatory Visit: Payer: Self-pay

## 2019-08-24 ENCOUNTER — Ambulatory Visit
Admission: RE | Admit: 2019-08-24 | Discharge: 2019-08-24 | Disposition: A | Discharge status: Home / Self Care | Payer: Medicare Other | Source: Ambulatory Visit | Attending: Radiation Oncology | Admitting: Radiation Oncology

## 2019-08-24 DIAGNOSIS — Z510 Encounter for antineoplastic radiation therapy: Secondary | ICD-10-CM | POA: Diagnosis not present

## 2019-08-25 ENCOUNTER — Other Ambulatory Visit: Payer: Self-pay

## 2019-08-25 ENCOUNTER — Ambulatory Visit
Admission: RE | Admit: 2019-08-25 | Discharge: 2019-08-25 | Disposition: A | Discharge status: Home / Self Care | Payer: Medicare Other | Source: Ambulatory Visit | Attending: Radiation Oncology | Admitting: Radiation Oncology

## 2019-08-25 DIAGNOSIS — Z510 Encounter for antineoplastic radiation therapy: Secondary | ICD-10-CM | POA: Diagnosis not present

## 2019-08-26 ENCOUNTER — Encounter: Payer: Self-pay | Admitting: Radiation Oncology

## 2019-08-26 ENCOUNTER — Ambulatory Visit
Admission: RE | Admit: 2019-08-26 | Discharge: 2019-08-26 | Disposition: A | Discharge status: Home / Self Care | Payer: Medicare Other | Source: Ambulatory Visit | Attending: Radiation Oncology | Admitting: Radiation Oncology

## 2019-08-26 ENCOUNTER — Other Ambulatory Visit: Payer: Self-pay

## 2019-08-26 DIAGNOSIS — Z510 Encounter for antineoplastic radiation therapy: Secondary | ICD-10-CM | POA: Diagnosis not present

## 2019-08-27 ENCOUNTER — Ambulatory Visit: Payer: Medicare Other

## 2019-08-28 ENCOUNTER — Ambulatory Visit: Payer: Medicare Other

## 2019-08-31 ENCOUNTER — Ambulatory Visit: Payer: Medicare Other

## 2019-09-01 ENCOUNTER — Encounter: Payer: Self-pay | Admitting: Medical Oncology

## 2019-09-01 ENCOUNTER — Ambulatory Visit: Payer: Medicare Other

## 2019-09-02 ENCOUNTER — Ambulatory Visit: Payer: Medicare Other

## 2019-09-03 ENCOUNTER — Ambulatory Visit: Payer: Medicare Other

## 2019-09-04 ENCOUNTER — Ambulatory Visit: Payer: Medicare Other

## 2019-09-07 ENCOUNTER — Ambulatory Visit: Payer: Medicare Other

## 2019-09-08 ENCOUNTER — Ambulatory Visit: Payer: Medicare Other

## 2019-09-09 ENCOUNTER — Ambulatory Visit: Payer: Medicare Other

## 2019-09-10 ENCOUNTER — Ambulatory Visit: Payer: Medicare Other

## 2019-09-11 ENCOUNTER — Ambulatory Visit: Payer: Medicare Other

## 2019-09-14 ENCOUNTER — Ambulatory Visit: Payer: Medicare Other

## 2019-09-15 ENCOUNTER — Ambulatory Visit: Payer: Medicare Other

## 2019-09-16 ENCOUNTER — Ambulatory Visit: Payer: Medicare Other

## 2019-09-17 ENCOUNTER — Ambulatory Visit: Payer: Medicare Other

## 2019-09-18 ENCOUNTER — Ambulatory Visit: Payer: Medicare Other

## 2019-09-21 ENCOUNTER — Ambulatory Visit: Payer: Medicare Other

## 2019-09-22 ENCOUNTER — Ambulatory Visit: Payer: Medicare Other

## 2019-09-23 ENCOUNTER — Ambulatory Visit: Payer: Medicare Other

## 2019-09-30 ENCOUNTER — Telehealth: Payer: Self-pay | Admitting: Oncology

## 2019-09-30 NOTE — Telephone Encounter (Signed)
Faxed medical records to [facility] @[phone]  [phone]

## 2019-10-06 ENCOUNTER — Encounter: Payer: Self-pay | Admitting: Urology

## 2019-10-06 ENCOUNTER — Other Ambulatory Visit: Payer: Self-pay

## 2019-10-06 ENCOUNTER — Telehealth: Payer: Self-pay

## 2019-10-06 NOTE — Progress Notes (Signed)
Patient has a 1 month telephone follow-up appointment with [name] PA on 10/07/19 at 2:00pm. Patient states having nocturia 1-2 times per night. Patient denies having dysuria or hematuria. Patient states that his urine stream is strong and steady. Patient states that he is emptying his bladder completely. Patient states that he has urgency and is able to make it to the bathroom. Patient denies having any leakage of urine. Patient denies having straining, hesitancy or fatigue.

## 2019-10-06 NOTE — Telephone Encounter (Signed)
Spoke with patient in regards to follow-up telephone appointment with [name] PA on 10/07/19 at 2:00pm. Patient verbalized understanding of telephone appointment date and time. Meaningful use and prostate questions were reviewed. TM

## 2019-10-07 ENCOUNTER — Encounter: Payer: Self-pay | Admitting: Urology

## 2019-10-07 ENCOUNTER — Other Ambulatory Visit: Payer: Self-pay

## 2019-10-07 ENCOUNTER — Ambulatory Visit
Admission: RE | Admit: 2019-10-07 | Discharge: 2019-10-07 | Disposition: A | Discharge status: Home / Self Care | Payer: Medicare Other | Source: Ambulatory Visit | Attending: Urology | Admitting: Urology

## 2019-10-07 DIAGNOSIS — C61 Malignant neoplasm of prostate: Secondary | ICD-10-CM

## 2019-10-07 NOTE — Progress Notes (Signed)
Radiation Oncology         [phone]) [phone] ________________________________  Name: Joseph Rose MRN: [id number]  Date: 10/07/2019  DOB: 13-May-1940  Post Treatment Note  CC: Kristie Cowman, MD  Kristie Cowman, MD  Diagnosis:   79 y.o. 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  The patient was notified in advance and gave permission to proceed with this visit format. He tolerated his recent radiation treatments very well.  He did not experience any ill side effects throughout the course of treatment.                              On review of systems, the patient states that he is doing very well overall.  He reports that towards the very end of treatment he had 1 day where he experienced some lower abdominal discomfort which resolved spontaneously within 24 hours.  He denies any increased LUTS, bowel issues or fatigue during or after his radiation treatments.  He specifically denies dysuria, gross hematuria, excessive daytime frequency, increased urgency or incontinence, difficulty emptying his bladder, straining to void or suprapubic pressure.  He reports a healthy appetite and is maintaining his weight.  He denies any abdominal pain, nausea, vomiting, diarrhea or constipation.  He has continued to remain very active, playing golf regularly which he very much enjoys.  He is currently without complaints and overall, extremely pleased with his  progress to date.  His last Eligard ADT injection was given on 05/13/2019 so he will be due for his next injection in late July or early August.  He is unsure of the specific date and time but reports that he does have a scheduled follow-up visit with Dr. Alinda Money around that time.  ALLERGIES:  has No Known Allergies.  Meds: Current Outpatient Medications  Medication Sig Dispense Refill  . ALLEGRA-D ALLERGY & CONGESTION 180-240 MG 24 hr tablet Take 1 tablet by mouth daily.    . Ascorbic Acid (VITAMIN C) 1000 MG tablet Take 2,000 mg by mouth daily.    . Azelastine HCl 0.15 % SOLN SMARTSIG:2 Spray(s) Both Nares Twice Daily PRN    . Cholecalciferol (VITAMIN D3) 50 MCG (2000 UT) TABS Take 1 tablet by mouth daily.    . Ginseng 100 MG CAPS Take by mouth.    . loratadine (CLARITIN) 10 MG tablet Take by mouth.    . NON FORMULARY Gout plex    . Turmeric (QC TUMERIC COMPLEX) 500 MG CAPS Take by mouth.    . Vitamin D, Ergocalciferol, (DRISDOL) 1.25 MG (50000 UT) CAPS capsule TK 1 C PO WEEKLY     No current facility-administered medications for this encounter.    Physical Findings:  vitals were not taken for this visit.   /Unable to assess due to telephone follow-up visit format.  Lab Findings: No results found for: WBC, HGB, HCT, MCV, PLT   Radiographic Findings:  No results found.  Impression/Plan: 1. 79 y.o. 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 He will continue to follow up with urology for ongoing PSA determinations and has an appointment scheduled with Dr. Alinda Money in August 2021, at which time he will be due for his next Eligard ADT injection. He understands what to expect with regards to PSA monitoring going forward. I will look forward to following his response to treatment via correspondence with urology, and would be happy to continue to participate in his care if clinically indicated. I talked to  the patient about what to expect in the future, including his risk for erectile dysfunction and rectal bleeding. I encouraged him to call or return to the office if he has any questions regarding his previous radiation or possible radiation side effects. He was comfortable with this plan and will follow up as needed.  Today, a comprehensive survivorship care plan and treatment summary was reviewed with the patient today detailing his prostate cancer diagnosis, treatment course, potential late/long-term effects of treatment, appropriate follow-up care with recommendations for the future, and patient education resources.  A copy of this summary, along with a letter will be sent to the patient's primary care provider via mail/fax/In Basket message after today's visit.   2. Cancer screening:  Due to Mr. Yanik's history and his age, he should receive screening for skin cancers, colon cancer, and lung cancer.  The information and recommendations are listed on the patient's comprehensive care plan/treatment summary and were reviewed in detail with the patient.     3. Health maintenance and wellness promotion: Mr. Norwood was encouraged to consume 5-7 servings of fruits and vegetables per day. He was provided a copy of the "Nutrition Rainbow" handout, as well as the handout "Take Control of Your Health and [place]" from the [facility].  He was also encouraged to engage in moderate to vigorous exercise for 30 minutes per day most days of the week. Information was provided regarding the [facility] fitness program, which is designed for cancer survivors to help them become more physically fit after cancer treatments. We discussed that a healthy BMI is 18.5-24.9 and that maintaining a healthy weight reduces risk of cancer recurrences.  He was instructed to limit his alcohol consumption and was encouraged to stop smoking.  Lastly, he was encouraged to use sunscreen and wear  protective clothing when in the sun.     4. Support services/counseling: It is not uncommon for this period of the patient's cancer care trajectory to be one of many emotions and stressors.  Mr. Flessner was encouraged to take advantage of our many support services programs, support groups, and/or counseling in coping with his new life as a cancer survivor after completing anti-cancer treatment.  He was offered support today through active listening and expressive supportive counseling.  He was given information regarding our available services and encouraged to contact me with any questions or for help enrolling in any of our support group/programs.      Nicholos Johns, PA-C

## 2020-01-26 NOTE — Progress Notes (Signed)
  Radiation Oncology         [phone]) [phone] ________________________________  Name: Joseph Rose MRN: [id number]  Date: 08/26/2019  DOB: 1940-07-12  End of Treatment Note  Diagnosis:   79 y.o. gentleman with locally advanced, stage T2a adenocarcinoma of the prostate with a Gleason's score of 4+3 and a PSA of 26.5 with ADT  Indication for treatment:  Curative, Definitive Radiotherapy       Radiation treatment dates:   4/15-5/12/21  Site/dose:   The prostate was treated to 55 Gy in 20 fractions of 2.75 Gy  Beams/energy:   The patient was treated with IMRT using volumetric arc therapy delivering 6 MV X-rays to clockwise and counterclockwise circumferential arcs with a 90 degree collimator offset to avoid dose scalloping.  Image guidance was performed with daily cone beam CT prior to each fraction to align to gold markers in the prostate and assure proper bladder and rectal fill volumes.  Immobilization was achieved with BodyFix custom mold.  Narrative: The patient tolerated radiation treatment relatively well.   The patient experienced some minor urinary irritation and modest fatigue.    Plan: The patient has completed radiation treatment. He will return to radiation oncology clinic for routine followup in one month. I advised him to call or return sooner if he has any questions or concerns related to his recovery or treatment. ________________________________  Sheral Apley. Tammi Klippel, M.D.

## 2020-11-16 ENCOUNTER — Encounter: Payer: Self-pay | Admitting: Orthopaedic Surgery

## 2020-11-16 ENCOUNTER — Ambulatory Visit: Payer: Medicare Other | Admitting: Orthopaedic Surgery

## 2020-11-16 ENCOUNTER — Other Ambulatory Visit: Payer: Self-pay

## 2020-11-16 ENCOUNTER — Ambulatory Visit: Payer: Self-pay

## 2020-11-16 VITALS — BP 128/82 | HR 82 | Ht 71.0 in | Wt 178.0 lb

## 2020-11-16 DIAGNOSIS — M25511 Pain in right shoulder: Secondary | ICD-10-CM

## 2020-11-18 DIAGNOSIS — M25511 Pain in right shoulder: Secondary | ICD-10-CM

## 2020-11-18 MED ORDER — METHYLPREDNISOLONE ACETATE 40 MG/ML IJ SUSP
40.0000 mg | INTRAMUSCULAR | Status: AC | PRN
  Administered 2020-11-18: 40 mg via INTRA_ARTICULAR

## 2020-11-18 MED ORDER — LIDOCAINE HCL 1 % IJ SOLN
0.5000 mL | INTRAMUSCULAR | Status: AC | PRN
  Administered 2020-11-18: .5 mL

## 2020-11-18 MED ORDER — BUPIVACAINE HCL 0.25 % IJ SOLN
4.0000 mL | INTRAMUSCULAR | Status: AC | PRN
  Administered 2020-11-18: 4 mL via INTRA_ARTICULAR

## 2020-11-18 NOTE — Progress Notes (Signed)
Office Visit Note   Patient: Joseph Rose           Date of Birth: 1940-07-09           MRN: [id number] Visit Date: 11/16/2020              Requested by: Kristie Cowman, MD [address] [place],  [place] [address] PCP: Kristie Cowman, MD   Assessment & Plan: Visit Diagnoses:  1. Acute pain of right shoulder     Plan: Subacromial injection performed.  He can return if he has persistent problems.  Follow-Up Instructions: No follow-ups on file.   Orders:  Orders Placed This Encounter  Procedures   XR Shoulder Right   No orders of the defined types were placed in this encounter.     Procedures: Large Joint Inj: R subacromial bursa on 11/18/2020 12:50 PM Indications: pain Details: 22 G 1.5 in needle  Arthrogram: No  Medications: 4 mL bupivacaine 0.25 %; 40 mg methylPREDNISolone acetate 40 MG/ML; 0.5 mL lidocaine 1 % Outcome: tolerated well, no immediate complications Procedure, treatment alternatives, risks and benefits explained, specific risks discussed. Consent was given by the patient. Immediately prior to procedure a time out was called to verify the correct patient, procedure, equipment, support staff and site/side marked as required. Patient was prepped and draped in the usual sterile fashion.      Clinical Data: No additional findings.   Subjective: Chief Complaint  Patient presents with   Right Shoulder - Pain    HPI 79 year old male with pain in his right arm soreness x2 months.  He states he still able to play golf it seems to loosen up when he swings some.  He has some discomfort outstretch reaching.  He states he got better with some Advil and time but still has persistent symptoms.  No numbness or tingling in his hand.  He was diagnosed with prostate cancer 2021.  He denies myelopathic symptoms no chills or fever.  Adenocarcinoma the prostate stage IV.  Review of Systems all other systems noncontributory to HPI.   Objective: Vital Signs: BP  128/82   Pulse 82   Ht '5\' 11"'$  (1.803 m)   Wt 178 lb (80.7 kg)   BMI 24.83 kg/m   Physical Exam Constitutional:      Appearance: He is well-developed.  HENT:     Head: Normocephalic and atraumatic.     Right Ear: External ear normal.     Left Ear: External ear normal.  Eyes:     Pupils: Pupils are equal, round, and reactive to light.  Neck:     Thyroid: No thyromegaly.     Trachea: No tracheal deviation.  Cardiovascular:     Rate and Rhythm: Normal rate.  Pulmonary:     Effort: Pulmonary effort is normal.     Breath sounds: No wheezing.  Abdominal:     General: Bowel sounds are normal.     Palpations: Abdomen is soft.  Musculoskeletal:     Cervical back: Neck supple.  Skin:    General: Skin is warm and dry.     Capillary Refill: Capillary refill takes less than 2 seconds.  Neurological:     Mental Status: He is alert and oriented to person, place, and time.  Psychiatric:        Behavior: Behavior normal.        Thought Content: Thought content normal.        Judgment: Judgment normal.    Ortho Exam  good cervical range of motion negative Spurling.  Upper extremity reflexes are 2+.  Positive impingement right shoulder.  Specialty Comments:  No specialty comments available.  Imaging: No results found.   PMFS History: Patient Active Problem List   Diagnosis Date Noted   Adenocarcinoma of prostate, stage 4 ([place]) 06/19/2018   Prostate nodule 05/12/2018   Elevated prostate specific antigen (PSA) 05/10/2018   Past Medical History:  Diagnosis Date   Prostate cancer ([place])     Family History  Problem Relation Age of Onset   Cancer Sister        cancer in blood   Prostate cancer Father 8   Breast cancer Neg Hx    Colon cancer Neg Hx    Pancreatic cancer Neg Hx     Past Surgical History:  Procedure Laterality Date   HERNIA REPAIR     PROSTATE BIOPSY     Social History   Occupational History   Not on file  Tobacco Use   Smoking status: Every Day     Types: Cigars    Start date: 66   Smokeless tobacco: Never   Tobacco comments:    only smokes when he plays golf.  Vaping Use   Vaping Use: Never used  Substance and Sexual Activity   Alcohol use: Yes   Drug use: Never   Sexual activity: Not Currently

## 2024-05-14 ENCOUNTER — Other Ambulatory Visit (HOSPITAL_COMMUNITY): Payer: Self-pay | Admitting: Urology

## 2024-05-14 DIAGNOSIS — C61 Malignant neoplasm of prostate: Secondary | ICD-10-CM

## 2024-05-27 ENCOUNTER — Encounter (HOSPITAL_COMMUNITY)
